# Patient Record
Sex: Female | Born: 1995 | Hispanic: Yes | Marital: Single
Health system: Southern US, Community
[De-identification: ages and names within clinical notes are randomized; demographics above are authoritative.]

---
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
## 2019-10-29 ENCOUNTER — Other Ambulatory Visit: Payer: Self-pay

## 2019-10-29 DIAGNOSIS — Z20822 Contact with and (suspected) exposure to covid-19: Secondary | ICD-10-CM

## 2019-11-01 ENCOUNTER — Ambulatory Visit: Payer: Self-pay

## 2019-11-01 LAB — NOVEL CORONAVIRUS, NAA: SARS-CoV-2, NAA: DETECTED — AB

## 2019-11-01 NOTE — Telephone Encounter (Signed)
Please see result note under covid result.
